# Patient Record
Sex: Male | Born: 1995 | Race: White | Hispanic: No | Marital: Single | Smoking: Never smoker
Health system: Southern US, Community
[De-identification: ages and names within clinical notes are randomized; demographics above are authoritative.]

---
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
## 2018-10-10 ENCOUNTER — Encounter (HOSPITAL_COMMUNITY): Payer: Self-pay | Admitting: Emergency Medicine

## 2018-10-10 ENCOUNTER — Emergency Department (HOSPITAL_COMMUNITY)
Admission: EM | Admit: 2018-10-10 | Discharge: 2018-10-10 | Disposition: A | Discharge status: Home / Self Care | Payer: 59 | Attending: Emergency Medicine | Admitting: Emergency Medicine

## 2018-10-10 DIAGNOSIS — R112 Nausea with vomiting, unspecified: Secondary | ICD-10-CM

## 2018-10-10 LAB — COMPREHENSIVE METABOLIC PANEL
ALT: 16 U/L (ref 0–44)
AST: 17 U/L (ref 15–41)
Albumin: 4.8 g/dL (ref 3.5–5.0)
Alkaline Phosphatase: 78 U/L (ref 38–126)
Anion gap: 9 (ref 5–15)
BUN: 19 mg/dL (ref 6–20)
CO2: 27 mmol/L (ref 22–32)
Calcium: 9.9 mg/dL (ref 8.9–10.3)
Chloride: 103 mmol/L (ref 98–111)
Creatinine, Ser: 0.83 mg/dL (ref 0.61–1.24)
GFR calc Af Amer: >60 mL/min (ref >60)
GFR calc non Af Amer: >60 mL/min (ref >60)
Glucose, Bld: 118 mg/dL — ABNORMAL HIGH (ref 70–99)
Potassium: 3.9 mmol/L (ref 3.5–5.1)
Sodium: 139 mmol/L (ref 135–145)
Total Bilirubin: 0.5 mg/dL (ref 0.3–1.2)
Total Protein: 7.2 g/dL (ref 6.5–8.1)

## 2018-10-10 LAB — CBC
HCT: 49.1 % (ref 39.0–52.0)
Hemoglobin: 16.4 g/dL (ref 13.0–17.0)
MCH: 30.4 pg (ref 26.0–34.0)
MCHC: 33.4 g/dL (ref 30.0–36.0)
MCV: 91.1 fL (ref 80.0–100.0)
Platelets: 239 10*3/uL (ref 150–400)
RBC: 5.39 MIL/uL (ref 4.22–5.81)
RDW: 11.9 % (ref 11.5–15.5)
WBC: 14.1 10*3/uL — ABNORMAL HIGH (ref 4.0–10.5)
nRBC: 0 % (ref 0.0–0.2)

## 2018-10-10 LAB — LIPASE, BLOOD: Lipase: 28 U/L (ref 11–51)

## 2018-10-10 MED ORDER — LIDOCAINE VISCOUS HCL 2 % MT SOLN
15.0000 mL | Freq: Once | OROMUCOSAL | Status: AC
  Administered 2018-10-10: 06:00:00 15 mL via ORAL
  Filled 2018-10-10: qty 15

## 2018-10-10 MED ORDER — ALUM & MAG HYDROXIDE-SIMETH 200-200-20 MG/5ML PO SUSP
30.0000 mL | Freq: Once | ORAL | Status: AC
  Administered 2018-10-10: 06:00:00 30 mL via ORAL
  Filled 2018-10-10: qty 30

## 2018-10-10 MED ORDER — ONDANSETRON 4 MG PO TBDP: 4.0000 mg | ORAL_TABLET | Freq: Three times a day (TID) | ORAL | 0 refills | Status: AC | PRN

## 2018-10-10 MED ORDER — SODIUM CHLORIDE 0.9% FLUSH: 3.0000 mL | Freq: Once | INTRAVENOUS | Status: DC

## 2018-10-10 NOTE — ED Triage Notes (Signed)
Patient here from home via EMS with complaints of nausea, vomiting that started at 12pm. States that feels like "acid reflux".

## 2018-10-10 NOTE — ED Provider Notes (Signed)
[facility]-EMERGENCY DEPT Provider Note   CSN: [id number] Arrival date & time: 10/10/18  0433     History   Chief Complaint Chief Complaint  Patient presents with  . Nausea  . Emesis    HPI Henry Burke is a 23 y.o. male.     The history is provided by the patient and medical records.  Emesis   23 year old male presenting to the ED for nausea, vomiting, and indigestion since yesterday around noon.  He states he vomited a few times, last episode was around 3:30 AM.  He has not had any diarrhea.  He denies fever or sick contacts.  He does live in a dorm room.  He was evaluated by student health, rapid test for COVID was negative but he was instructed to quarantine until his send out test returns.  States he is no longer feeling nauseated but is still having some heartburn type symptoms and belching.  History reviewed. No pertinent past medical history.  There are no active problems to display for this patient.   History reviewed. No pertinent surgical history.      Home Medications    Prior to Admission medications   Not on File    Family History No family history on file.  Social History Social History   Tobacco Use  . Smoking status: Never Smoker  . Smokeless tobacco: Never Used  Substance Use Topics  . Alcohol use: Not on file  . Drug use: Not on file     Allergies   Patient has no allergy information on record.   Review of Systems Review of Systems  Gastrointestinal: Positive for nausea and vomiting.  All other systems reviewed and are negative.    Physical Exam Updated Vital Signs BP 101/78 (BP Location: Left Arm)   Pulse 88   Temp 98.1 F (36.7 C) (Oral)   Resp 18   Ht 5\' 11"  (1.803 m)   Wt 54.4 kg   SpO2 100%   BMI 16.74 kg/m   Physical Exam Vitals signs and nursing note reviewed.  Constitutional:      Appearance: He is well-developed.     Comments: Intermittently belching during exam but no dry heaving or  vomiting  HENT:     Head: Normocephalic and atraumatic.  Eyes:     Conjunctiva/sclera: Conjunctivae normal.     Pupils: Pupils are equal, round, and reactive to light.  Neck:     Musculoskeletal: Normal range of motion.  Cardiovascular:     Rate and Rhythm: Normal rate and regular rhythm.     Heart sounds: Normal heart sounds.  Pulmonary:     Effort: Pulmonary effort is normal.     Breath sounds: Normal breath sounds. No wheezing or rhonchi.  Abdominal:     General: Bowel sounds are normal.     Palpations: Abdomen is soft.     Tenderness: There is no abdominal tenderness.     Comments: Soft, nontender, no peritoneal signs  Musculoskeletal: Normal range of motion.  Skin:    General: Skin is warm and dry.  Neurological:     Mental Status: He is alert and oriented to person, place, and time.      ED Treatments / Results  Labs (all labs ordered are listed, but only abnormal results are displayed) Labs Reviewed  COMPREHENSIVE METABOLIC PANEL - Abnormal; Notable for the following components:      Result Value   Glucose, Bld 118 (*)    All other components  within normal limits  CBC - Abnormal; Notable for the following components:   WBC 14.1 (*)    All other components within normal limits  LIPASE, BLOOD  URINALYSIS, ROUTINE W REFLEX MICROSCOPIC    EKG None  Radiology No results found.  Procedures Procedures (including critical care time)  Medications Ordered in ED Medications  sodium chloride flush (NS) 0.9 % injection 3 mL (has no administration in time range)  alum & mag hydroxide-simeth (MAALOX/MYLANTA) 200-200-20 MG/5ML suspension 30 mL (30 mLs Oral Given 10/10/18 0629)    And  lidocaine (XYLOCAINE) 2 % viscous mouth solution 15 mL (15 mLs Oral Given 10/10/18 6256)     Initial Impression / Assessment and Plan / ED Course  I have reviewed the triage vital signs and the nursing notes.  Pertinent labs & imaging results that were available during my care of the  patient were reviewed by me and considered in my medical decision making (see chart for details).  24 year old male here with nausea, vomiting, and indigestion since noon yesterday.  He is afebrile and nontoxic.  His abdomen is soft and benign.  His labs are overall reassuring, mild leukocytosis but may be reactive from vomiting.  He has not had any emesis in the past several hours.  He is still having some heartburn and belching.  He was given GI cocktail with resolution of this.  He is tolerating oral fluids well.  Suspect this is likely viral process.  His rapid test for COVID-19 was negative, send out test is pending.  He will continue quarantine protocol as per [facility].  Continue symptomatic care at home, push oral fluids and gentle diet.  Follow-up with PCP.  Return here for any new/acute changes.  Final Clinical Impressions(s) / ED Diagnoses   Final diagnoses:  Non-intractable vomiting with nausea, unspecified vomiting type    ED Discharge Orders         Ordered    ondansetron (ZOFRAN ODT) 4 MG disintegrating tablet  Every 8 hours PRN     10/10/18 0653           Larene Pickett, PA-C 10/10/18 0703    Merryl Hacker, MD 10/10/18 [phone]

## 2018-10-10 NOTE — Discharge Instructions (Signed)
Take the prescribed medication as directed.  Try to drink fluids to stay hydrated, gentle diet for now and progress back to normal as tolerated. Follow-up with your primary care doctor. Return to the ED for new or worsening symptoms.

## 2020-07-23 ENCOUNTER — Emergency Department (HOSPITAL_COMMUNITY)
Admission: EM | Admit: 2020-07-23 | Discharge: 2020-07-23 | Disposition: A | Discharge status: Home / Self Care | Payer: No Typology Code available for payment source | Attending: Emergency Medicine | Admitting: Emergency Medicine

## 2020-07-23 ENCOUNTER — Other Ambulatory Visit: Payer: Self-pay

## 2020-07-23 ENCOUNTER — Encounter (HOSPITAL_COMMUNITY): Payer: Self-pay | Admitting: *Deleted

## 2020-07-23 DIAGNOSIS — Z5321 Procedure and treatment not carried out due to patient leaving prior to being seen by health care provider: Secondary | ICD-10-CM | POA: Insufficient documentation

## 2020-07-23 DIAGNOSIS — R110 Nausea: Secondary | ICD-10-CM | POA: Insufficient documentation

## 2020-07-23 DIAGNOSIS — R109 Unspecified abdominal pain: Secondary | ICD-10-CM | POA: Diagnosis not present

## 2020-07-23 LAB — URINALYSIS, ROUTINE W REFLEX MICROSCOPIC
Bilirubin Urine: NEGATIVE
Glucose, UA: NEGATIVE mg/dL
Hgb urine dipstick: NEGATIVE
Ketones, ur: 20 mg/dL — AB
Leukocytes,Ua: NEGATIVE
Nitrite: NEGATIVE
Protein, ur: NEGATIVE mg/dL
Specific Gravity, Urine: 1.025 (ref 1.005–1.030)
pH: 7 (ref 5.0–8.0)

## 2020-07-23 LAB — CBC
HCT: 46 % (ref 39.0–52.0)
Hemoglobin: 15.6 g/dL (ref 13.0–17.0)
MCH: 30.4 pg (ref 26.0–34.0)
MCHC: 33.9 g/dL (ref 30.0–36.0)
MCV: 89.7 fL (ref 80.0–100.0)
Platelets: 220 10*3/uL (ref 150–400)
RBC: 5.13 MIL/uL (ref 4.22–5.81)
RDW: 11.8 % (ref 11.5–15.5)
WBC: 7.7 10*3/uL (ref 4.0–10.5)
nRBC: 0 % (ref 0.0–0.2)

## 2020-07-23 LAB — COMPREHENSIVE METABOLIC PANEL
ALT: 17 U/L (ref 0–44)
AST: 17 U/L (ref 15–41)
Albumin: 5 g/dL (ref 3.5–5.0)
Alkaline Phosphatase: 78 U/L (ref 38–126)
Anion gap: 8 (ref 5–15)
BUN: 23 mg/dL — ABNORMAL HIGH (ref 6–20)
CO2: 25 mmol/L (ref 22–32)
Calcium: 9.5 mg/dL (ref 8.9–10.3)
Chloride: 107 mmol/L (ref 98–111)
Creatinine, Ser: 0.85 mg/dL (ref 0.61–1.24)
GFR, Estimated: >60 mL/min (ref >60)
Glucose, Bld: 100 mg/dL — ABNORMAL HIGH (ref 70–99)
Potassium: 3.5 mmol/L (ref 3.5–5.1)
Sodium: 140 mmol/L (ref 135–145)
Total Bilirubin: 0.8 mg/dL (ref 0.3–1.2)
Total Protein: 7.4 g/dL (ref 6.5–8.1)

## 2020-07-23 LAB — LIPASE, BLOOD: Lipase: 34 U/L (ref 11–51)

## 2020-07-23 NOTE — ED Triage Notes (Signed)
Pt complains of nausea and abdominal pain since last night. Reports some nausea/abd issues lasting a day or so recently. No vomiting or diarrhea.

## 2020-07-25 ENCOUNTER — Other Ambulatory Visit: Payer: Self-pay

## 2020-07-25 ENCOUNTER — Emergency Department (HOSPITAL_COMMUNITY): Payer: No Typology Code available for payment source

## 2020-07-25 ENCOUNTER — Emergency Department (HOSPITAL_COMMUNITY)
Admission: EM | Admit: 2020-07-25 | Discharge: 2020-07-25 | Disposition: A | Discharge status: Home / Self Care | Payer: No Typology Code available for payment source | Attending: Emergency Medicine | Admitting: Emergency Medicine

## 2020-07-25 ENCOUNTER — Encounter (HOSPITAL_COMMUNITY): Payer: Self-pay | Admitting: *Deleted

## 2020-07-25 DIAGNOSIS — E876 Hypokalemia: Secondary | ICD-10-CM | POA: Diagnosis not present

## 2020-07-25 DIAGNOSIS — R1084 Generalized abdominal pain: Secondary | ICD-10-CM

## 2020-07-25 DIAGNOSIS — F1210 Cannabis abuse, uncomplicated: Secondary | ICD-10-CM | POA: Diagnosis not present

## 2020-07-25 DIAGNOSIS — R112 Nausea with vomiting, unspecified: Secondary | ICD-10-CM | POA: Diagnosis not present

## 2020-07-25 DIAGNOSIS — E860 Dehydration: Secondary | ICD-10-CM | POA: Diagnosis not present

## 2020-07-25 LAB — CBC WITH DIFFERENTIAL/PLATELET
Abs Immature Granulocytes: 0.02 10*3/uL (ref 0.00–0.07)
Basophils Absolute: 0 10*3/uL (ref 0.0–0.1)
Basophils Relative: 1 %
Eosinophils Absolute: 0 10*3/uL (ref 0.0–0.5)
Eosinophils Relative: 0 %
HCT: 44.1 % (ref 39.0–52.0)
Hemoglobin: 15.2 g/dL (ref 13.0–17.0)
Immature Granulocytes: 0 %
Lymphocytes Relative: 24 %
Lymphs Abs: 1.5 10*3/uL (ref 0.7–4.0)
MCH: 30.9 pg (ref 26.0–34.0)
MCHC: 34.5 g/dL (ref 30.0–36.0)
MCV: 89.6 fL (ref 80.0–100.0)
Monocytes Absolute: 0.5 10*3/uL (ref 0.1–1.0)
Monocytes Relative: 9 %
Neutro Abs: 4 10*3/uL (ref 1.7–7.7)
Neutrophils Relative %: 66 %
Platelets: 209 10*3/uL (ref 150–400)
RBC: 4.92 MIL/uL (ref 4.22–5.81)
RDW: 11.9 % (ref 11.5–15.5)
WBC: 6 10*3/uL (ref 4.0–10.5)
nRBC: 0 % (ref 0.0–0.2)

## 2020-07-25 LAB — COMPREHENSIVE METABOLIC PANEL
ALT: 16 U/L (ref 0–44)
AST: 17 U/L (ref 15–41)
Albumin: 4.7 g/dL (ref 3.5–5.0)
Alkaline Phosphatase: 68 U/L (ref 38–126)
Anion gap: 9 (ref 5–15)
BUN: 19 mg/dL (ref 6–20)
CO2: 24 mmol/L (ref 22–32)
Calcium: 9.4 mg/dL (ref 8.9–10.3)
Chloride: 107 mmol/L (ref 98–111)
Creatinine, Ser: 0.7 mg/dL (ref 0.61–1.24)
GFR, Estimated: >60 mL/min (ref >60)
Glucose, Bld: 91 mg/dL (ref 70–99)
Potassium: 3.1 mmol/L — ABNORMAL LOW (ref 3.5–5.1)
Sodium: 140 mmol/L (ref 135–145)
Total Bilirubin: 1.1 mg/dL (ref 0.3–1.2)
Total Protein: 7.2 g/dL (ref 6.5–8.1)

## 2020-07-25 LAB — LIPASE, BLOOD: Lipase: 33 U/L (ref 11–51)

## 2020-07-25 LAB — URINALYSIS, ROUTINE W REFLEX MICROSCOPIC
Bilirubin Urine: NEGATIVE
Glucose, UA: NEGATIVE mg/dL
Hgb urine dipstick: NEGATIVE
Ketones, ur: 80 mg/dL — AB
Leukocytes,Ua: NEGATIVE
Nitrite: NEGATIVE
Protein, ur: NEGATIVE mg/dL
Specific Gravity, Urine: 1.016 (ref 1.005–1.030)
pH: 6 (ref 5.0–8.0)

## 2020-07-25 MED ORDER — POTASSIUM CHLORIDE IN NACL 20-0.9 MEQ/L-% IV SOLN
Freq: Once | INTRAVENOUS | Status: AC
  Filled 2020-07-25: qty 1000

## 2020-07-25 MED ORDER — DICYCLOMINE HCL 10 MG/ML IM SOLN
20.0000 mg | Freq: Once | INTRAMUSCULAR | Status: AC
Start: 2020-07-25 — End: 2020-07-25
  Administered 2020-07-25: 20 mg via INTRAMUSCULAR
  Filled 2020-07-25: qty 2

## 2020-07-25 MED ORDER — SODIUM CHLORIDE 0.9 % IV BOLUS
1000.0000 mL | Freq: Once | INTRAVENOUS | Status: AC
  Administered 2020-07-25: 1000 mL via INTRAVENOUS

## 2020-07-25 NOTE — ED Triage Notes (Addendum)
Pt complains of nausea, abdominal pain x 2 days. He reports dry heaving yesterday. Had diarrhea this morning. Had labs drawn here yesterday, left w/o being seen d/t feeling better.  Reports quitting daily marijuana habit 3 days ago.

## 2020-07-25 NOTE — ED Provider Notes (Signed)
[facility]-EMERGENCY DEPT Provider Note   CSN: [id number] Arrival date & time: 07/25/20  0846     History Chief Complaint  Patient presents with   Abdominal Pain    Henry Burke is a 25 y.o. male.  HPI  Patient presents with abdominal pain, decreased bowel movements. Patient has no history of abdominal surgery, no history of blockage, but explicitly voices this as a concern. He notes no solid bowel movements in 3 days.  There is associated nausea, and he had vomiting 2 days ago.  Patient has no substantial medical problems, does smoke marijuana, but states that he willingly stopped 3 days ago.  Since that time he has had some improvement in nausea with Zofran, was seen, evaluated, left prior to clinician evaluation 2 days ago. Patient provides consent to talk about his case with his mother present.  History reviewed. No pertinent past medical history.  There are no problems to display for this patient.   History reviewed. No pertinent surgical history.     No family history on file.  Social History   Tobacco Use   Smoking status: Never   Smokeless tobacco: Never    Home Medications Prior to Admission medications   Medication Sig Start Date End Date Taking? Authorizing Provider  ondansetron (ZOFRAN ODT) 4 MG disintegrating tablet Take 1 tablet (4 mg total) by mouth every 8 (eight) hours as needed for nausea. 10/10/18   Garlon Hatchet, PA-C    Allergies    Patient has no allergy information on record.  Review of Systems   Review of Systems  Constitutional:        Per HPI, otherwise negative  HENT:         Per HPI, otherwise negative  Respiratory:         Per HPI, otherwise negative  Cardiovascular:        Per HPI, otherwise negative  Gastrointestinal:  Positive for abdominal pain, constipation, nausea and vomiting.  Endocrine:       Negative aside from HPI  Genitourinary:        Neg aside from HPI   Musculoskeletal:        Per HPI,  otherwise negative  Skin: Negative.   Neurological:  Negative for syncope.   Physical Exam Updated Vital Signs BP 128/70 (BP Location: Right Arm)   Pulse 90   Temp 98.7 F (37.1 C) (Oral)   Resp 18   SpO2 100%   Physical Exam Vitals and nursing note reviewed.  Constitutional:      General: He is not in acute distress.    Appearance: He is well-developed.  HENT:     Head: Normocephalic and atraumatic.  Eyes:     Conjunctiva/sclera: Conjunctivae normal.  Cardiovascular:     Rate and Rhythm: Normal rate and regular rhythm.  Pulmonary:     Effort: Pulmonary effort is normal. No respiratory distress.     Breath sounds: No stridor.  Abdominal:     General: There is no distension.     Tenderness: There is generalized abdominal tenderness.  Skin:    General: Skin is warm and dry.  Neurological:     Mental Status: He is alert and oriented to person, place, and time.    ED Results / Procedures / Treatments   Labs (all labs ordered are listed, but only abnormal results are displayed) Labs Reviewed  COMPREHENSIVE METABOLIC PANEL - Abnormal; Notable for the following components:      Result Value  Potassium 3.1 (*)    All other components within normal limits  URINALYSIS, ROUTINE W REFLEX MICROSCOPIC - Abnormal; Notable for the following components:   Ketones, ur 80 (*)    All other components within normal limits  LIPASE, BLOOD  CBC WITH DIFFERENTIAL/PLATELET    EKG None  Radiology DG Abd Acute W/Chest  Result Date: 07/25/2020 CLINICAL DATA:  Abdominal pain since Tuesday EXAM: DG ABDOMEN ACUTE WITH 1 VIEW CHEST COMPARISON:  None. FINDINGS: There is no evidence of dilated bowel loops or free intraperitoneal air. No radiopaque calculi or other significant radiographic abnormality is seen. Heart size and mediastinal contours are within normal limits. Both lungs are clear. IMPRESSION: Negative abdominal radiographs.  No acute cardiopulmonary disease. Electronically Signed    By: Signa Kell M.D.   On: 07/25/2020 12:17    Procedures Procedures   Medications Ordered in ED Medications  0.9 % NaCl with KCl 20 mEq/ L  infusion ( Intravenous New Bag/Given 07/25/20 1351)  dicyclomine (BENTYL) injection 20 mg (20 mg Intramuscular Given 07/25/20 1042)  sodium chloride 0.9 % bolus 1,000 mL (0 mLs Intravenous Stopped 07/25/20 1249)    ED Course  I have reviewed the triage vital signs and the nursing notes.  Pertinent labs & imaging results that were available during my care of the patient were reviewed by me and considered in my medical decision making (see chart for details).   2:52 PM Patient in no distress, hemodynamically unremarkable.  Has not accompanied by his mother.  We discussed all findings of the reassuring x-ray of his belly, chest.  No evidence for obstruction or other acute findings.  Patient also has risk profile that is low for obstruction. Findings were notable for substantial ketone urea, mild hypokalemia, both consistent with poor recent p.o. intake.  Some suspicion for withdrawal from recent marijuana use contributing to his pain.  Absent evidence for acute other findings, patient is appropriate for ongoing fluid resuscitation, including potassium, fluids, Bentyl, subsequent discharged with outpatient follow-up. MDM Rules/Calculators/A&P MDM Number of Diagnoses or Management Options Dehydration: new, needed workup Generalized abdominal pain: new, needed workup Hypokalemia: new, needed workup   Amount and/or Complexity of Data Reviewed Clinical lab tests: ordered and reviewed Tests in the radiology section of CPT: ordered and reviewed Tests in the medicine section of CPT: reviewed and ordered Decide to obtain previous medical records or to obtain history from someone other than the patient: yes Obtain history from someone other than the patient: yes Review and summarize past medical records: yes Independent visualization of images, tracings,  or specimens: yes  Risk of Complications, Morbidity, and/or Mortality Presenting problems: high Diagnostic procedures: high Management options: high  Critical Care Total time providing critical care: < 30 minutes  Patient Progress Patient progress: stable   Final Clinical Impression(s) / ED Diagnoses Final diagnoses:  Generalized abdominal pain  Dehydration  Hypokalemia    Rx / DC Orders ED Discharge Orders     None        Gerhard Munch, MD 07/25/20 1453

## 2020-07-25 NOTE — Discharge Instructions (Addendum)
As discussed, today's evaluation has been generally reassuring aside from evidence for dehydration and hypokalemia or low potassium.  Please stay well-hydrated and do not hesitate to return here for concerning changes.

## 2021-11-01 IMAGING — CR DG ABDOMEN ACUTE W/ 1V CHEST
3 series · 3 of 3 positions shown · non-contrast
Comparison: None.

CLINICAL DATA: Abdominal pain since [REDACTED]

EXAM:
DG ABDOMEN ACUTE WITH 1 VIEW CHEST

[w chest pa]
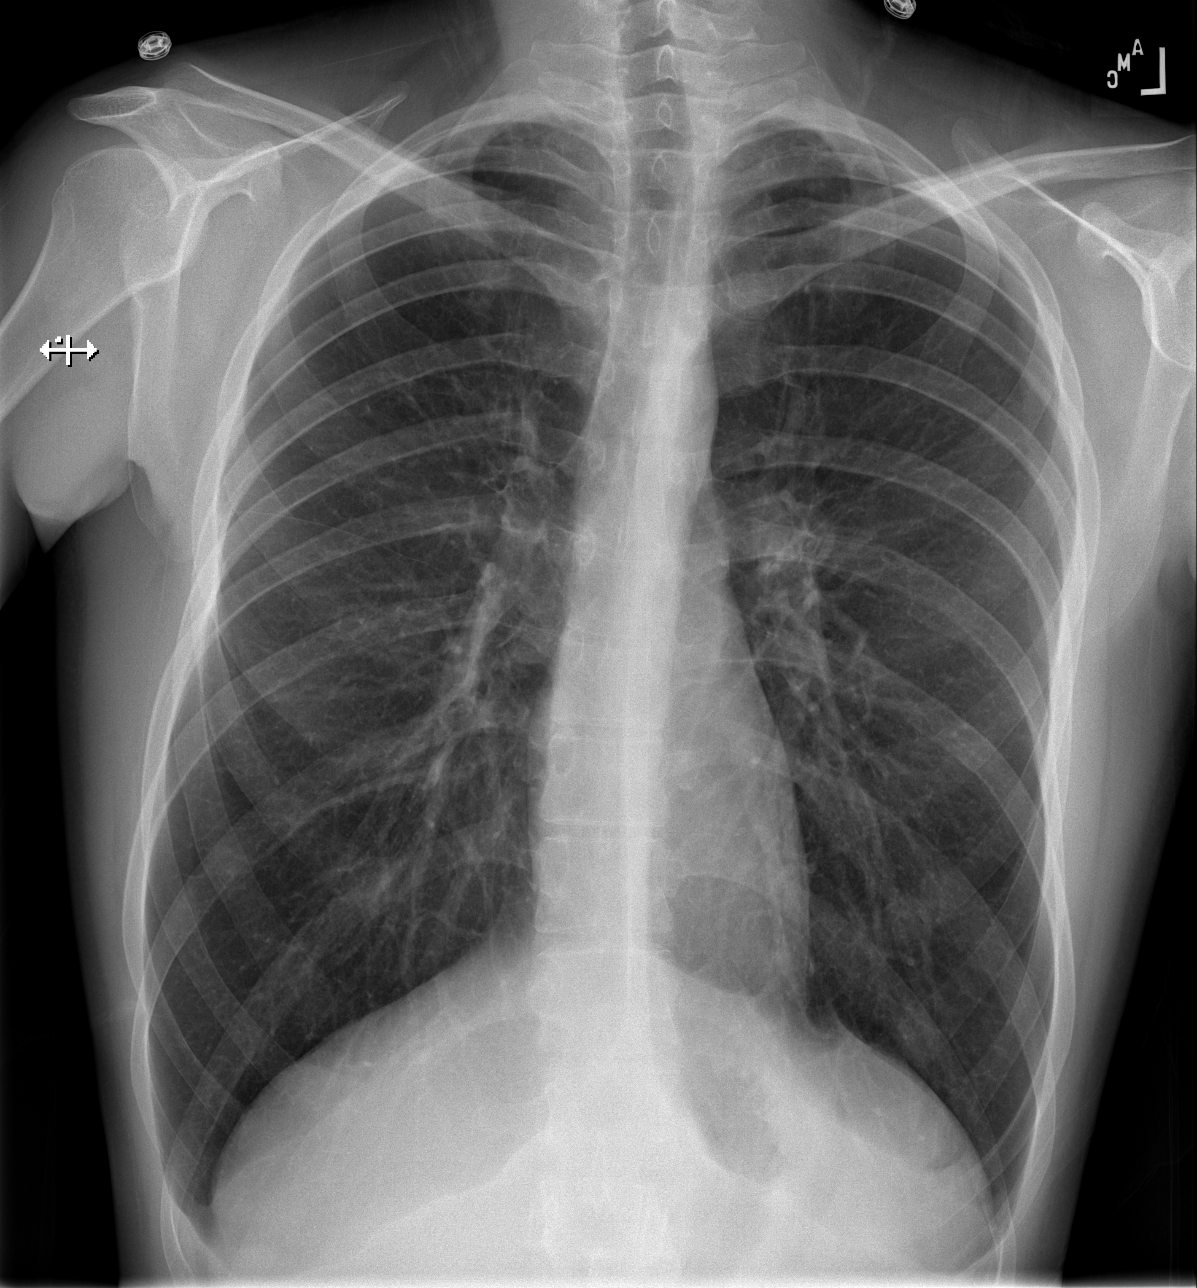

[w abdomen upright]
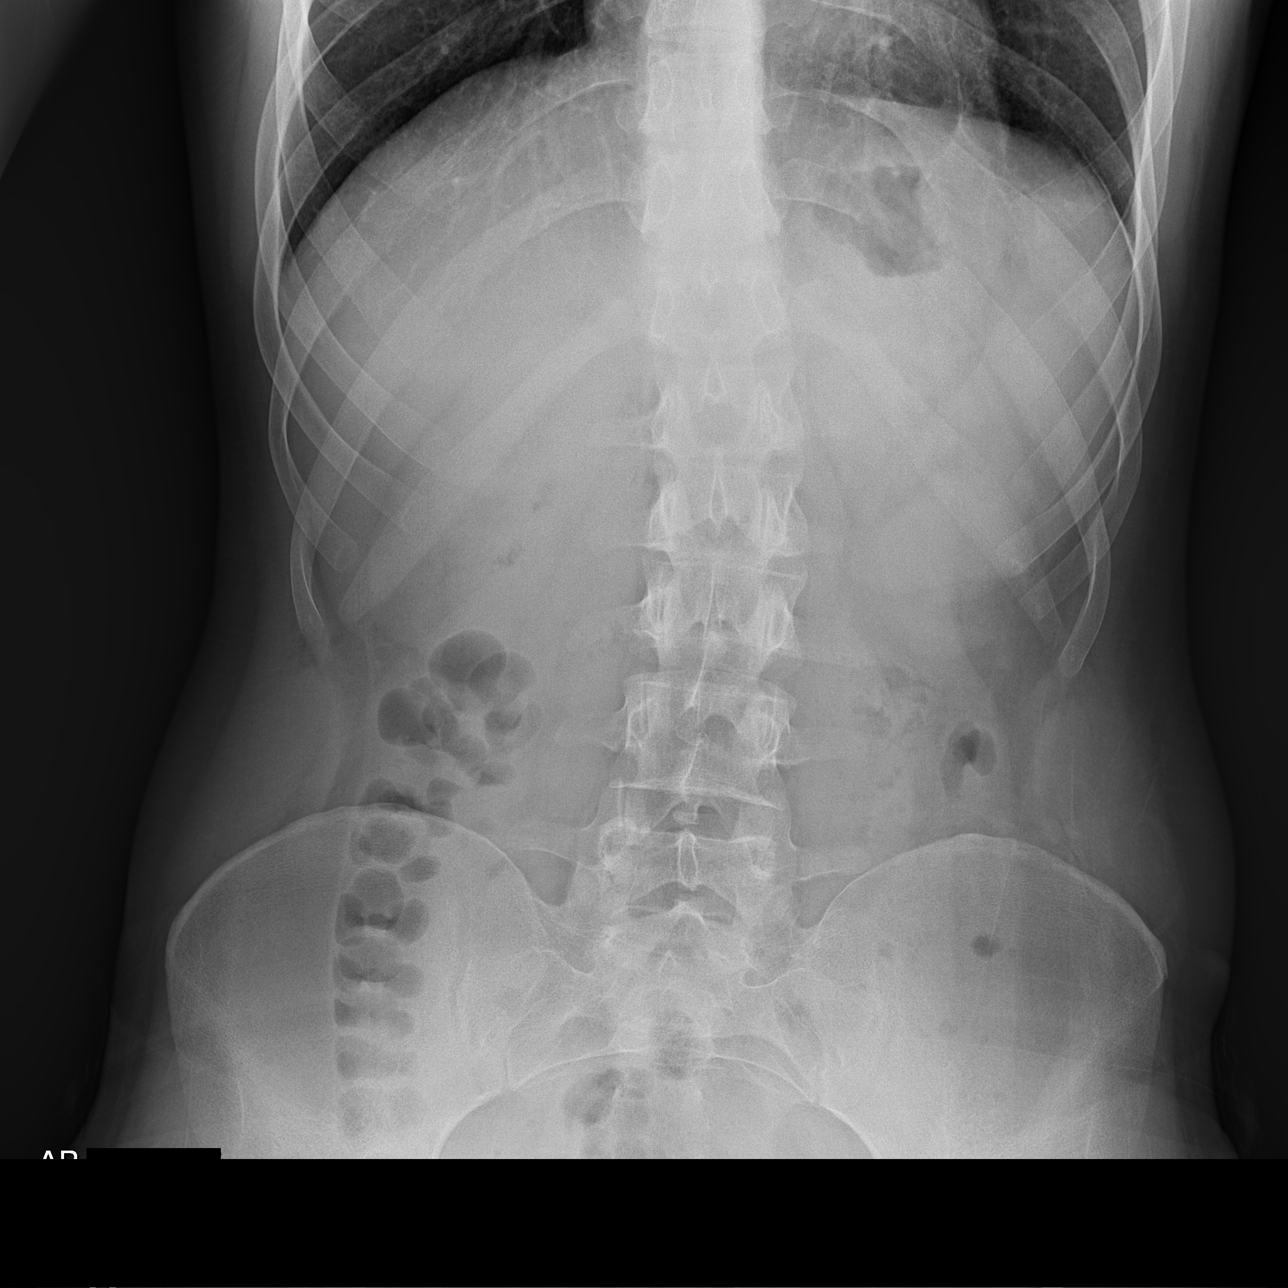

[t abdomen supine]
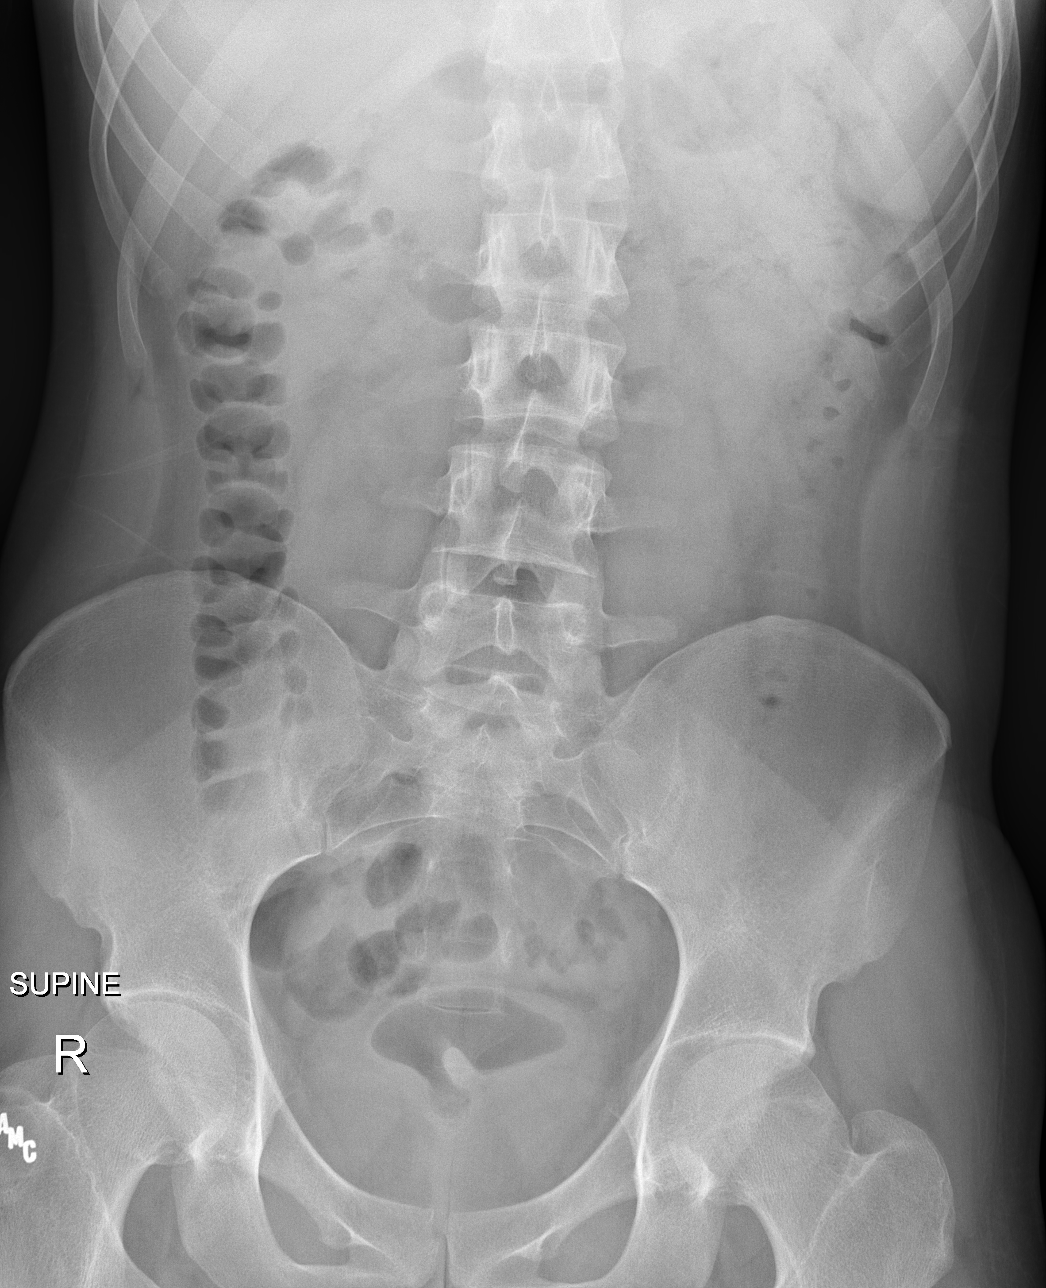

[3 of 3 positions shown; findings below may reference images not displayed]

FINDINGS: There is no evidence of dilated bowel loops or free intraperitoneal
air. No radiopaque calculi or other significant radiographic
abnormality is seen. Heart size and mediastinal contours are within
normal limits. Both lungs are clear.
IMPRESSION: Negative abdominal radiographs.  No acute cardiopulmonary disease.
